# Patient Record
Sex: Female | Born: 1980 | Race: Asian | Hispanic: No | Marital: Married | Smoking: Never smoker
Health system: Southern US, Community
[De-identification: ages and names within clinical notes are randomized; demographics above are authoritative.]

## PROBLEM LIST (undated history)

## (undated) ENCOUNTER — Inpatient Hospital Stay (HOSPITAL_COMMUNITY): Payer: Self-pay

## (undated) DIAGNOSIS — Z789 Other specified health status: Secondary | ICD-10-CM

## (undated) HISTORY — PX: WISDOM TOOTH EXTRACTION: SHX21

## (undated) HISTORY — PX: APPENDECTOMY: SHX54

---

## 2012-12-04 ENCOUNTER — Ambulatory Visit
Admission: RE | Admit: 2012-12-04 | Discharge: 2012-12-04 | Disposition: A | Discharge status: Home / Self Care | Payer: Medicaid Other | Source: Ambulatory Visit | Attending: Family Medicine | Admitting: Family Medicine

## 2012-12-04 ENCOUNTER — Other Ambulatory Visit: Payer: Self-pay | Admitting: Family Medicine

## 2012-12-04 DIAGNOSIS — M549 Dorsalgia, unspecified: Secondary | ICD-10-CM

## 2013-05-12 LAB — CYTOLOGY - PAP: Pap: NEGATIVE

## 2013-05-31 ENCOUNTER — Inpatient Hospital Stay (HOSPITAL_COMMUNITY)
Admission: AD | Admit: 2013-05-31 | Discharge: 2013-05-31 | Disposition: A | Discharge status: Home / Self Care | Payer: Medicaid Other | Source: Ambulatory Visit | Attending: Obstetrics | Admitting: Obstetrics

## 2013-05-31 ENCOUNTER — Encounter (HOSPITAL_COMMUNITY): Payer: Self-pay

## 2013-05-31 DIAGNOSIS — O210 Mild hyperemesis gravidarum: Secondary | ICD-10-CM | POA: Insufficient documentation

## 2013-05-31 DIAGNOSIS — O99891 Other specified diseases and conditions complicating pregnancy: Secondary | ICD-10-CM | POA: Insufficient documentation

## 2013-05-31 DIAGNOSIS — R12 Heartburn: Secondary | ICD-10-CM | POA: Insufficient documentation

## 2013-05-31 DIAGNOSIS — O26891 Other specified pregnancy related conditions, first trimester: Secondary | ICD-10-CM

## 2013-05-31 DIAGNOSIS — O9989 Other specified diseases and conditions complicating pregnancy, childbirth and the puerperium: Secondary | ICD-10-CM

## 2013-05-31 HISTORY — DX: Other specified health status: Z78.9

## 2013-05-31 LAB — URINE MICROSCOPIC-ADD ON: RBC / HPF: NONE SEEN RBC/hpf (ref <3)

## 2013-05-31 LAB — URINALYSIS, ROUTINE W REFLEX MICROSCOPIC
Bilirubin Urine: NEGATIVE
GLUCOSE, UA: 100 mg/dL — AB
KETONES UR: NEGATIVE mg/dL
Nitrite: NEGATIVE
PROTEIN: NEGATIVE mg/dL
Specific Gravity, Urine: 1.01 (ref 1.005–1.030)
Urobilinogen, UA: 0.2 mg/dL (ref 0.0–1.0)
pH: 6.5 (ref 5.0–8.0)

## 2013-05-31 LAB — POCT PREGNANCY, URINE: Preg Test, Ur: POSITIVE — AB

## 2013-05-31 MED ORDER — FAMOTIDINE 20 MG PO TABS: 20.0000 mg | ORAL_TABLET | Freq: Two times a day (BID) | ORAL | Status: DC

## 2013-05-31 MED ORDER — METOCLOPRAMIDE HCL 10 MG PO TABS: 10.0000 mg | ORAL_TABLET | Freq: Four times a day (QID) | ORAL | Status: DC

## 2013-05-31 NOTE — MAU Note (Signed)
Feels like food is not being digested, is getting stuck in her esophagus and still feels very hungry. Has had heartburn as well. Had one episode of diarrhea 2 days ago. Tender breasts.

## 2013-05-31 NOTE — MAU Provider Note (Signed)
History     CSN: [id number]  Arrival date and time: 05/31/13 1413   First Provider Initiated Contact with Patient 05/31/13 1541      Chief Complaint  Patient presents with  . Shortness of Breath   HPI 33 y.o. G2P1001 at [redacted]w[redacted]d with nausea/vomiting, feeling that "food isn't going all the way down", reflux, gas. Has pregnancy termination scheduled for Tuesday.    Past Medical History  Diagnosis Date  . Medical history non-contributory     Past Surgical History  Procedure Laterality Date  . Appendectomy    . Wisdom tooth extraction      History reviewed. No pertinent family history.  History  Substance Use Topics  . Smoking status: Never Smoker   . Smokeless tobacco: Never Used  . Alcohol Use: No    Allergies: No Known Allergies  No prescriptions prior to admission    Review of Systems  Constitutional: Positive for malaise/fatigue. Negative for fever and chills.  Respiratory: Negative.   Cardiovascular: Negative.   Gastrointestinal: Positive for heartburn, nausea and vomiting. Negative for abdominal pain, diarrhea and constipation.  Genitourinary: Negative for dysuria, urgency, frequency, hematuria and flank pain.       Negative for vaginal bleeding, vaginal discharge  Musculoskeletal: Negative.   Neurological: Negative.   Psychiatric/Behavioral: Negative.    Physical Exam   Blood pressure 97/70, pulse 72, temperature 98.2 F (36.8 C), temperature source Oral, resp. rate 16, height 5\' 7"  (1.702 m), weight 112 lb (50.803 kg), last menstrual period 04/12/2013.  Physical Exam  Nursing note and vitals reviewed. Constitutional: She is oriented to person, place, and time. She appears well-developed and well-nourished. No distress.  Cardiovascular: Normal rate.   Respiratory: Effort normal.  GI: Soft. There is no tenderness.  Musculoskeletal: Normal range of motion.  Neurological: She is alert and oriented to person, place, and time.  Skin: Skin is warm and dry.   Psychiatric: She has a normal mood and affect.    MAU Course  Procedures Results for orders placed during the hospital encounter of 05/31/13 (from the past 24 hour(s))  URINALYSIS, ROUTINE W REFLEX MICROSCOPIC     Status: Abnormal   Collection Time    05/31/13  2:20 PM      Result Value Ref Range   Color, Urine YELLOW  YELLOW   APPearance CLEAR  CLEAR   Specific Gravity, Urine 1.010  1.005 - 1.030   pH 6.5  5.0 - 8.0   Glucose, UA 100 (*) NEGATIVE mg/dL   Hgb urine dipstick TRACE (*) NEGATIVE   Bilirubin Urine NEGATIVE  NEGATIVE   Ketones, ur NEGATIVE  NEGATIVE mg/dL   Protein, ur NEGATIVE  NEGATIVE mg/dL   Urobilinogen, UA 0.2  0.0 - 1.0 mg/dL   Nitrite NEGATIVE  NEGATIVE   Leukocytes, UA SMALL (*) NEGATIVE  URINE MICROSCOPIC-ADD ON     Status: Abnormal   Collection Time    05/31/13  2:20 PM      Result Value Ref Range   Squamous Epithelial / LPF FEW (*) RARE   WBC, UA 0-2  <3 WBC/hpf   RBC / HPF    <3 RBC/hpf   Value: NO FORMED ELEMENTS SEEN ON URINE MICROSCOPIC EXAMINATION   Bacteria, UA FEW (*) RARE  POCT PREGNANCY, URINE     Status: Abnormal   Collection Time    05/31/13  2:37 PM      Result Value Ref Range   Preg Test, Ur POSITIVE (*) NEGATIVE  Assessment and Plan   1. Heartburn in pregnancy in first trimester   Normal exam, general pregnancy discomforts - rev'd normal pregnancy sx vs. Warning signs, f/u as scheduled    Medication List         famotidine 20 MG tablet  Commonly known as:  PEPCID  Take 1 tablet (20 mg total) by mouth 2 (two) times daily.     metoCLOPramide 10 MG tablet  Commonly known as:  REGLAN  Take 1 tablet (10 mg total) by mouth 4 (four) times daily.            Follow-up Information   Follow up with [facility]. (as scheduled)         Ariana Baker 05/31/2013, 3:51 PM

## 2013-06-01 NOTE — MAU Provider Note (Signed)
Attestation of Attending Supervision of Advanced Practitioner (CNM/NP): Evaluation and management procedures were performed by the Advanced Practitioner under my supervision and collaboration.  I have reviewed the Advanced Practitioner's note and chart, and I agree with the management and plan.  Snyder Colavito 06/01/2013 7:51 AM

## 2014-02-15 ENCOUNTER — Encounter (HOSPITAL_COMMUNITY): Payer: Self-pay

## 2014-04-05 ENCOUNTER — Encounter (HOSPITAL_COMMUNITY): Payer: Self-pay | Admitting: *Deleted

## 2014-08-03 ENCOUNTER — Other Ambulatory Visit (HOSPITAL_COMMUNITY)
Admission: RE | Admit: 2014-08-03 | Discharge: 2014-08-03 | Disposition: A | Discharge status: Home / Self Care | Payer: 59 | Source: Ambulatory Visit | Attending: Family Medicine | Admitting: Family Medicine

## 2014-08-03 ENCOUNTER — Other Ambulatory Visit: Payer: Self-pay | Admitting: Family Medicine

## 2014-08-03 DIAGNOSIS — Z124 Encounter for screening for malignant neoplasm of cervix: Secondary | ICD-10-CM | POA: Insufficient documentation

## 2014-08-05 LAB — CYTOLOGY - PAP

## 2015-12-01 ENCOUNTER — Inpatient Hospital Stay (HOSPITAL_COMMUNITY)
Admission: AD | Admit: 2015-12-01 | Discharge: 2015-12-01 | Disposition: A | Discharge status: Home / Self Care | Payer: Medicaid Other | Source: Ambulatory Visit | Attending: Obstetrics and Gynecology | Admitting: Obstetrics and Gynecology

## 2015-12-01 ENCOUNTER — Inpatient Hospital Stay (HOSPITAL_COMMUNITY): Payer: Medicaid Other

## 2015-12-01 ENCOUNTER — Encounter (HOSPITAL_COMMUNITY): Payer: Self-pay | Admitting: *Deleted

## 2015-12-01 DIAGNOSIS — O3411 Maternal care for benign tumor of corpus uteri, first trimester: Secondary | ICD-10-CM | POA: Insufficient documentation

## 2015-12-01 DIAGNOSIS — O209 Hemorrhage in early pregnancy, unspecified: Secondary | ICD-10-CM | POA: Diagnosis not present

## 2015-12-01 DIAGNOSIS — Z3A01 Less than 8 weeks gestation of pregnancy: Secondary | ICD-10-CM | POA: Insufficient documentation

## 2015-12-01 DIAGNOSIS — R109 Unspecified abdominal pain: Secondary | ICD-10-CM | POA: Diagnosis present

## 2015-12-01 DIAGNOSIS — M549 Dorsalgia, unspecified: Secondary | ICD-10-CM | POA: Diagnosis present

## 2015-12-01 DIAGNOSIS — D259 Leiomyoma of uterus, unspecified: Secondary | ICD-10-CM | POA: Diagnosis not present

## 2015-12-01 DIAGNOSIS — O3441 Maternal care for other abnormalities of cervix, first trimester: Secondary | ICD-10-CM | POA: Insufficient documentation

## 2015-12-01 DIAGNOSIS — N939 Abnormal uterine and vaginal bleeding, unspecified: Secondary | ICD-10-CM | POA: Diagnosis present

## 2015-12-01 LAB — WET PREP, GENITAL
CLUE CELLS WET PREP: NONE SEEN
Sperm: NONE SEEN
TRICH WET PREP: NONE SEEN
Yeast Wet Prep HPF POC: NONE SEEN

## 2015-12-01 LAB — CBC
HEMATOCRIT: 39.8 % (ref 36.0–46.0)
Hemoglobin: 12.9 g/dL (ref 12.0–15.0)
MCH: 29.4 pg (ref 26.0–34.0)
MCHC: 32.4 g/dL (ref 30.0–36.0)
MCV: 90.7 fL (ref 78.0–100.0)
PLATELETS: 251 10*3/uL (ref 150–400)
RBC: 4.39 MIL/uL (ref 3.87–5.11)
RDW: 13.8 % (ref 11.5–15.5)
WBC: 7.8 10*3/uL (ref 4.0–10.5)

## 2015-12-01 LAB — URINALYSIS, ROUTINE W REFLEX MICROSCOPIC
BILIRUBIN URINE: NEGATIVE
Glucose, UA: NEGATIVE mg/dL
Ketones, ur: NEGATIVE mg/dL
NITRITE: NEGATIVE
PROTEIN: NEGATIVE mg/dL
Specific Gravity, Urine: 1.01 (ref 1.005–1.030)
pH: 5.5 (ref 5.0–8.0)

## 2015-12-01 LAB — URINE MICROSCOPIC-ADD ON

## 2015-12-01 LAB — HCG, QUANTITATIVE, PREGNANCY: hCG, Beta Chain, Quant, S: 830 m[IU]/mL — ABNORMAL HIGH (ref <5)

## 2015-12-01 LAB — POCT PREGNANCY, URINE: PREG TEST UR: POSITIVE — AB

## 2015-12-01 LAB — ABO/RH: ABO/RH(D): A POS

## 2015-12-01 NOTE — MAU Provider Note (Signed)
Chief Complaint: Abdominal Pain; Back Pain; and Vaginal Bleeding   First Provider Initiated Contact with Patient 12/01/15 Ariana Baker is a 35 y.o. G2P1001 at [redacted]w[redacted]d by LMP who presents to maternity admissions reporting bleeding and low back pain since the weekend.  Bleeding started 2 days ago, like period. She denies vaginal itching/burning, urinary symptoms, h/a, dizziness, n/v, or fever/chills.    Vaginal Bleeding  The patient's primary symptoms include pelvic pain and vaginal bleeding. The patient's pertinent negatives include no genital itching or genital lesions. This is a new problem. The current episode started in the past 7 days. The problem occurs constantly. The problem has been unchanged. The pain is mild. She is pregnant. Associated symptoms include abdominal pain and back pain. Pertinent negatives include no chills, diarrhea, fever, nausea or vomiting. The vaginal discharge was bloody. The vaginal bleeding is lighter than menses. She has not been passing clots. She has not been passing tissue.    RN Note: Pt had pos HPT on Saturday, started having back pain on Monday.  Also having lower abd pain since Tuesday, started bleeding Tuesday night   Past Medical History:  Diagnosis Date  . Medical history non-contributory    Past Surgical History:  Procedure Laterality Date  . APPENDECTOMY    . WISDOM TOOTH EXTRACTION     Social History   Social History  . Marital status: Married    Spouse name: N/A  . Number of children: N/A  . Years of education: N/A   Occupational History  . Not on file.   Social History Main Topics  . Smoking status: Never Smoker  . Smokeless tobacco: Never Used  . Alcohol use No  . Drug use: No  . Sexual activity: Yes    Birth control/ protection: None   Other Topics Concern  . Not on file   Social History Narrative  . No narrative on file   No current facility-administered medications on file prior to encounter.    No  current outpatient prescriptions on file prior to encounter.   No Known Allergies  I have reviewed patient's Past Medical Hx, Surgical Hx, Family Hx, Social Hx, medications and allergies.   ROS:  Review of Systems  Constitutional: Negative for chills and fever.  Gastrointestinal: Positive for abdominal pain. Negative for diarrhea, nausea and vomiting.  Genitourinary: Positive for pelvic pain and vaginal bleeding.  Musculoskeletal: Positive for back pain.   Other systems negative  Physical Exam  Patient Vitals for the past 24 hrs:  BP Temp Temp src Pulse Resp Height Weight  12/01/15 1327 94/61 98.3 F (36.8 C) Oral 73 18 5\' 7"  (1.702 m) 53.5 kg (118 lb)    Physical Exam  Constitutional: Well-developed, well-nourished female in no acute distress.  Cardiovascular: normal rate Respiratory: normal effort GI: Abd soft, non-tender. Pos BS x 4 MS: Extremities nontender, no edema, normal ROM Neurologic: Alert and oriented x 4.  GU: Neg CVAT.  PELVIC EXAM: Cervix pink, visually closed, without lesion, scant red discharge, vaginal walls and external genitalia normal Bimanual exam: Cervix 0/long/high, firm, anterior, neg CMT, uterus nontender, enlarged 6 week size, adnexa without tenderness, enlargement, or mass   LAB RESULTS Results for orders placed or performed during the hospital encounter of 12/01/15 (from the past 72 hour(s))  GC/Chlamydia probe amp ([place])not at [facility]     Status: None   Collection Time: 12/01/15 12:00 AM  Result Value Ref Range  Chlamydia Negative     Comment: Normal Reference Range - Negative   Neisseria gonorrhea Negative     Comment: Normal Reference Range - Negative  Urinalysis, Routine w reflex microscopic (not at [facility])     Status: Abnormal   Collection Time: 12/01/15  1:32 PM  Result Value Ref Range   Color, Urine YELLOW YELLOW   APPearance HAZY (A) CLEAR   Specific Gravity, Urine 1.010 1.005 - 1.030   pH 5.5 5.0 - 8.0   Glucose, UA NEGATIVE  NEGATIVE mg/dL   Hgb urine dipstick LARGE (A) NEGATIVE   Bilirubin Urine NEGATIVE NEGATIVE   Ketones, ur NEGATIVE NEGATIVE mg/dL   Protein, ur NEGATIVE NEGATIVE mg/dL   Nitrite NEGATIVE NEGATIVE   Leukocytes, UA SMALL (A) NEGATIVE  Urine microscopic-add on     Status: Abnormal   Collection Time: 12/01/15  1:32 PM  Result Value Ref Range   Squamous Epithelial / LPF 6-30 (A) NONE SEEN   WBC, UA 0-5 0 - 5 WBC/hpf   RBC / HPF TOO NUMEROUS TO COUNT 0 - 5 RBC/hpf   Bacteria, UA MANY (A) NONE SEEN  CBC     Status: None   Collection Time: 12/01/15  1:51 PM  Result Value Ref Range   WBC 7.8 4.0 - 10.5 K/uL   RBC 4.39 3.87 - 5.11 MIL/uL   Hemoglobin 12.9 12.0 - 15.0 g/dL   HCT 39.8 36.0 - 46.0 %   MCV 90.7 78.0 - 100.0 fL   MCH 29.4 26.0 - 34.0 pg   MCHC 32.4 30.0 - 36.0 g/dL   RDW 13.8 11.5 - 15.5 %   Platelets 251 150 - 400 K/uL  HIV antibody (routine testing) (NOT for [facility])     Status: None   Collection Time: 12/01/15  1:51 PM  Result Value Ref Range   HIV Screen 4th Generation wRfx Non Reactive Non Reactive    Comment: (NOTE) Performed At: [facility] [place], [place] [id number] Lindon Romp MD [id number]   hCG, quantitative, pregnancy     Status: Abnormal   Collection Time: 12/01/15  1:59 PM  Result Value Ref Range   hCG, Beta Chain, Quant, S 830 (H) <5 mIU/mL    Comment:          GEST. AGE      CONC.  (mIU/mL)   <=1 WEEK        5 - 50     2 WEEKS       50 - 500     3 WEEKS       100 - 10,000     4 WEEKS     1,000 - 30,000     5 WEEKS     3,500 - 115,000   6-8 WEEKS     12,000 - 270,000    12 WEEKS     15,000 - 220,000        FEMALE AND NON-PREGNANT FEMALE:     LESS THAN 5 mIU/mL   ABO/Rh     Status: None   Collection Time: 12/01/15  1:59 PM  Result Value Ref Range   ABO/RH(D) A POS   Pregnancy, urine POC     Status: Abnormal   Collection Time: 12/01/15  2:11 PM  Result Value Ref Range   Preg Test, Ur POSITIVE (A) NEGATIVE     Comment:        THE SENSITIVITY OF THIS METHODOLOGY IS >24 mIU/mL   Wet prep, genital  Status: Abnormal   Collection Time: 12/01/15  2:16 PM  Result Value Ref Range   Yeast Wet Prep HPF POC NONE SEEN NONE SEEN   Trich, Wet Prep NONE SEEN NONE SEEN   Clue Cells Wet Prep HPF POC NONE SEEN NONE SEEN   WBC, Wet Prep HPF POC MANY (A) NONE SEEN    Comment: MANY BACTERIA SEEN   Sperm NONE SEEN     IMAGING US Ob Comp Less 14 Wks  Result Date: 12/01/2015 CLINICAL DATA:  Sharp right-sided pain. Spotting for 2 days. Positive pregnancy test. EXAM: OBSTETRIC <14 WK [place] AND TRANSVAGINAL OB US TECHNIQUE: Both transabdominal and transvaginal ultrasound examinations were performed for complete evaluation of the gestation as well as the maternal uterus, adnexal regions, and pelvic cul-de-sac. Transvaginal technique was performed to assess early pregnancy. COMPARISON:  None. FINDINGS: Intrauterine gestational sac: None Maternal uterus/adnexae: Uterus is heterogeneous with multiple nabothian cysts in the cervix. Uterus appears to be retroflexed. There is a heterogeneous structure within the uterus that could represent a fibroid measuring 3.9 x 3.1 x 3.7 cm. Possible second fibroid measuring up to 1.6 cm. Normal appearance of the right ovary measuring 2.6 x 1.7 x 1.6 cm. Left ovary measures 3.1 x 2.1 x 2.6 cm. Heterogeneous structure in left ovary could represent a corpus luteum. No significant free fluid. IMPRESSION: No intrauterine pregnancy visualized. By definition, in the setting of a positive pregnancy test, this reflects a pregnancy of unknown location. Differential considerations include early normal IUP, abnormal IUP/missed abortion, or nonvisualized ectopic pregnancy. Serial beta HCG is suggested. Consider repeat pelvic ultrasound in 14 days. Uterine fibroids. Electronically Signed   By: Markus Daft M.D.   On: 12/01/2015 16:24   US Ob Transvaginal  Result Date: 12/01/2015 CLINICAL DATA:  Sharp  right-sided pain. Spotting for 2 days. Positive pregnancy test. EXAM: OBSTETRIC <14 WK [place] AND TRANSVAGINAL OB US TECHNIQUE: Both transabdominal and transvaginal ultrasound examinations were performed for complete evaluation of the gestation as well as the maternal uterus, adnexal regions, and pelvic cul-de-sac. Transvaginal technique was performed to assess early pregnancy. COMPARISON:  None. FINDINGS: Intrauterine gestational sac: None Maternal uterus/adnexae: Uterus is heterogeneous with multiple nabothian cysts in the cervix. Uterus appears to be retroflexed. There is a heterogeneous structure within the uterus that could represent a fibroid measuring 3.9 x 3.1 x 3.7 cm. Possible second fibroid measuring up to 1.6 cm. Normal appearance of the right ovary measuring 2.6 x 1.7 x 1.6 cm. Left ovary measures 3.1 x 2.1 x 2.6 cm. Heterogeneous structure in left ovary could represent a corpus luteum. No significant free fluid. IMPRESSION: No intrauterine pregnancy visualized. By definition, in the setting of a positive pregnancy test, this reflects a pregnancy of unknown location. Differential considerations include early normal IUP, abnormal IUP/missed abortion, or nonvisualized ectopic pregnancy. Serial beta HCG is suggested. Consider repeat pelvic ultrasound in 14 days. Uterine fibroids. Electronically Signed   By: Markus Daft M.D.   On: 12/01/2015 16:24    MAU Management/MDM: Ordered usual first trimester r/o ectopic labs.   Pelvic exam and cultures done Will check baseline Ultrasound to rule out ectopic.   This bleeding/pain can represent a normal pregnancy with bleeding, spontaneous abortion or even an ectopic which can be life-threatening.  The process as listed above helps to determine which of these is present.  Still cannot rule out ectopic pregnancy. Discussed results.  Plan repeat HCG in 48 hours.   ASSESSMENT Pregnancy at [redacted]w[redacted]d by LMP Nothing seen in  uterus yet Threatened abortion vs ectopic vs  very early pregnancy  PLAN Discharge home Plan to repeat HCG level in 48 hours in clinic per 11:00 am schedule Will repeat  Ultrasound in about 7-10 days if HCG levels double appropriately     Medication List    You have not been prescribed any medications.     Pt stable at time of discharge. Encouraged to return here or to other Urgent Care/ED if she develops worsening of symptoms, increase in pain, fever, or other concerning symptoms.    Hansel Feinstein CNM, MSN Certified Nurse-Midwife 12/01/2015  2:08 PM

## 2015-12-01 NOTE — MAU Note (Signed)
Pt had pos HPT on Saturday, started having back pain on Monday.  Also having lower abd pain since Tuesday, started bleeding Tuesday night.

## 2015-12-01 NOTE — Discharge Instructions (Signed)

## 2015-12-02 ENCOUNTER — Encounter (HOSPITAL_COMMUNITY): Payer: Self-pay

## 2015-12-02 ENCOUNTER — Other Ambulatory Visit: Payer: Self-pay | Admitting: Advanced Practice Midwife

## 2015-12-02 ENCOUNTER — Inpatient Hospital Stay (HOSPITAL_COMMUNITY)
Admission: AD | Admit: 2015-12-02 | Discharge: 2015-12-02 | Disposition: A | Discharge status: Home / Self Care | Payer: Medicaid Other | Source: Ambulatory Visit | Attending: Obstetrics and Gynecology | Admitting: Obstetrics and Gynecology

## 2015-12-02 DIAGNOSIS — Z3A01 Less than 8 weeks gestation of pregnancy: Secondary | ICD-10-CM | POA: Diagnosis present

## 2015-12-02 DIAGNOSIS — O468X1 Other antepartum hemorrhage, first trimester: Secondary | ICD-10-CM | POA: Insufficient documentation

## 2015-12-02 DIAGNOSIS — Z9889 Other specified postprocedural states: Secondary | ICD-10-CM | POA: Diagnosis not present

## 2015-12-02 DIAGNOSIS — O039 Complete or unspecified spontaneous abortion without complication: Secondary | ICD-10-CM | POA: Insufficient documentation

## 2015-12-02 DIAGNOSIS — O034 Incomplete spontaneous abortion without complication: Secondary | ICD-10-CM

## 2015-12-02 LAB — GC/CHLAMYDIA PROBE AMP (~~LOC~~) NOT AT ARMC
Chlamydia: NEGATIVE
NEISSERIA GONORRHEA: NEGATIVE

## 2015-12-02 LAB — HIV ANTIBODY (ROUTINE TESTING W REFLEX): HIV Screen 4th Generation wRfx: NONREACTIVE

## 2015-12-02 LAB — HCG, QUANTITATIVE, PREGNANCY: HCG, BETA CHAIN, QUANT, S: 340 m[IU]/mL — AB (ref <5)

## 2015-12-02 NOTE — MAU Provider Note (Signed)
Chief Complaint: Vaginal Bleeding   First Provider Initiated Contact with Patient 12/02/15 2019        SUBJECTIVE Ariana Baker is a 35 y.o. G3P1011 at [redacted]w[redacted]d by LMP who presents to maternity admissions reporting increased bleeding "like first day of period".  No cramping.. She denies vaginal itching/burning, urinary symptoms, h/a, dizziness, n/v, or fever/chills. Was seen here yesterday by me for bleeding in pregnancy. Pregnancy is not desired.  Was scheduled for repeat HCG tomorrow.    Vaginal Bleeding  The patient's primary symptoms include vaginal bleeding. The patient's pertinent negatives include no genital itching, genital lesions, genital odor or pelvic pain. This is a recurrent problem. The current episode started yesterday. The problem occurs constantly. The problem has been gradually worsening. The patient is experiencing no pain. She is pregnant. Pertinent negatives include no abdominal pain, back pain, chills, constipation, dysuria, fever or vomiting. The vaginal discharge was bloody. The vaginal bleeding is typical of menses. She has been passing clots. She has not been passing tissue. Nothing aggravates the symptoms. She has tried nothing for the symptoms.      Past Medical History:  Diagnosis Date  . Medical history non-contributory    Past Surgical History:  Procedure Laterality Date  . APPENDECTOMY    . WISDOM TOOTH EXTRACTION     Social History   Social History  . Marital status: Married    Spouse name: N/A  . Number of children: N/A  . Years of education: N/A   Occupational History  . Not on file.   Social History Main Topics  . Smoking status: Never Smoker  . Smokeless tobacco: Never Used  . Alcohol use No  . Drug use: No  . Sexual activity: Yes    Birth control/ protection: None   Other Topics Concern  . Not on file   Social History Narrative  . No narrative on file   No current facility-administered medications on file prior to encounter.    No  current outpatient prescriptions on file prior to encounter.   No Known Allergies  I have reviewed patient's Past Medical Hx, Surgical Hx, Family Hx, Social Hx, medications and allergies.   ROS:  Review of Systems  Constitutional: Negative for chills and fever.  Gastrointestinal: Negative for abdominal pain, constipation and vomiting.  Genitourinary: Positive for vaginal bleeding. Negative for dysuria and pelvic pain.  Musculoskeletal: Negative for back pain.   Other systems negative  Physical Exam  Patient Vitals for the past 24 hrs:  BP Temp Temp src Pulse Resp SpO2 Height Weight  12/02/15 1949 - - - - - - 5\' 7"  (1.702 m) 53.1 kg (117 lb)  12/02/15 1946 97/66 98 F (36.7 C) Oral 69 16 100 % - -   Physical Exam  Constitutional: Well-developed, well-nourished female in no acute distress.  Cardiovascular: normal rate Respiratory: normal effort GI: Abd soft, non-tender. Pos BS x 4 MS: Extremities nontender, no edema, normal ROM Neurologic: Alert and oriented x 4.  GU: Neg CVAT.  PELVIC EXAM:  Moderate bright red blood on pad,  No overt hemorrhage   LAB RESULTS  --/--/A POS (08/17 1359)  IMAGING US Ob Comp Less 14 Wks  Result Date: 12/01/2015 CLINICAL DATA:  Sharp right-sided pain. Spotting for 2 days. Positive pregnancy test. EXAM: OBSTETRIC <14 WK [place] AND TRANSVAGINAL OB US TECHNIQUE: Both transabdominal and transvaginal ultrasound examinations were performed for complete evaluation of the gestation as well as the maternal uterus, adnexal regions, and pelvic cul-de-sac. Transvaginal  technique was performed to assess early pregnancy. COMPARISON:  None. FINDINGS: Intrauterine gestational sac: None Maternal uterus/adnexae: Uterus is heterogeneous with multiple nabothian cysts in the cervix. Uterus appears to be retroflexed. There is a heterogeneous structure within the uterus that could represent a fibroid measuring 3.9 x 3.1 x 3.7 cm. Possible second fibroid measuring up to 1.6  cm. Normal appearance of the right ovary measuring 2.6 x 1.7 x 1.6 cm. Left ovary measures 3.1 x 2.1 x 2.6 cm. Heterogeneous structure in left ovary could represent a corpus luteum. No significant free fluid. IMPRESSION: No intrauterine pregnancy visualized. By definition, in the setting of a positive pregnancy test, this reflects a pregnancy of unknown location. Differential considerations include early normal IUP, abnormal IUP/missed abortion, or nonvisualized ectopic pregnancy. Serial beta HCG is suggested. Consider repeat pelvic ultrasound in 14 days. Uterine fibroids. Electronically Signed   By: Markus Daft M.D.   On: 12/01/2015 16:24   US Ob Transvaginal  Result Date: 12/01/2015 CLINICAL DATA:  Sharp right-sided pain. Spotting for 2 days. Positive pregnancy test. EXAM: OBSTETRIC <14 WK [place] AND TRANSVAGINAL OB US TECHNIQUE: Both transabdominal and transvaginal ultrasound examinations were performed for complete evaluation of the gestation as well as the maternal uterus, adnexal regions, and pelvic cul-de-sac. Transvaginal technique was performed to assess early pregnancy. COMPARISON:  None. FINDINGS: Intrauterine gestational sac: None Maternal uterus/adnexae: Uterus is heterogeneous with multiple nabothian cysts in the cervix. Uterus appears to be retroflexed. There is a heterogeneous structure within the uterus that could represent a fibroid measuring 3.9 x 3.1 x 3.7 cm. Possible second fibroid measuring up to 1.6 cm. Normal appearance of the right ovary measuring 2.6 x 1.7 x 1.6 cm. Left ovary measures 3.1 x 2.1 x 2.6 cm. Heterogeneous structure in left ovary could represent a corpus luteum. No significant free fluid. IMPRESSION: No intrauterine pregnancy visualized. By definition, in the setting of a positive pregnancy test, this reflects a pregnancy of unknown location. Differential considerations include early normal IUP, abnormal IUP/missed abortion, or nonvisualized ectopic pregnancy. Serial beta HCG  is suggested. Consider repeat pelvic ultrasound in 14 days. Uterine fibroids. Electronically Signed   By: Markus Daft M.D.   On: 12/01/2015 16:24    MAU Management/MDM: Repeat HCG level drawn. Level has decreased since yesterday. Discussed with patient this indicated an inevitable abortion. Will plan to monitor weekly until zero.  Pt indicates understanding  This bleeding/pain can represent a normal pregnancy with bleeding, spontaneous abortion or even an ectopic which can be life-threatening.  The process as listed above helps to determine which of these is present.    ASSESSMENT Pregnancy at [redacted]w[redacted]d by LMP Vaginal bleeding, increased Inevitable spontaneous abortion  PLAN Discharge home Plan to repeat HCG level in 1 week in clinic per 11:00 am schedule Follow levels to zero, weekly     Medication List    You have not been prescribed any medications.     Pt stable at time of discharge. Encouraged to return here or to other Urgent Care/ED if she develops worsening of symptoms, increase in pain, fever, or other concerning symptoms.    Hansel Feinstein CNM, MSN Certified Nurse-Midwife 12/02/2015  8:19 PM

## 2015-12-02 NOTE — Discharge Instructions (Signed)
Incomplete Miscarriage A miscarriage is the sudden loss of an unborn baby (fetus) before the 20th week of pregnancy. In an incomplete miscarriage, parts of the fetus or placenta (afterbirth) remain in the body.  Having a miscarriage can be an emotional experience. Talk with your health care provider about any questions you may have about miscarrying, the grieving process, and your future pregnancy plans. CAUSES   Problems with the fetal chromosomes that make it impossible for the baby to develop normally. Problems with the baby's genes or chromosomes are most often the result of errors that occur by chance as the embryo divides and grows. The problems are not inherited from the parents.  Infection of the cervix or uterus.  Hormone problems.  Problems with the cervix, such as having an incompetent cervix. This is when the tissue in the cervix is not strong enough to hold the pregnancy.  Problems with the uterus, such as an abnormally shaped uterus, uterine fibroids, or congenital abnormalities.  Certain medical conditions.  Smoking, drinking alcohol, or taking illegal drugs.  Trauma. SYMPTOMS   Vaginal bleeding or spotting, with or without cramps or pain.  Pain or cramping in the abdomen or lower back.  Passing fluid, tissue, or blood clots from the vagina. DIAGNOSIS  Your health care provider will perform a physical exam. You may also have an ultrasound to confirm the miscarriage. Blood or urine tests may also be ordered. TREATMENT   Usually, a dilation and curettage (D&C) procedure is performed. During a D&C procedure, the cervix is widened (dilated) and any remaining fetal or placental tissue is gently removed from the uterus.  Antibiotic medicines are prescribed if there is an infection. Other medicines may be given to reduce the size of the uterus (contract) if there is a lot of bleeding.  If you have Rh negative blood and your baby was Rh positive, you will need a Rho (D)  immune globulin shot. This shot will protect any future baby from having Rh blood problems in future pregnancies.  You may be confined to bed rest. This means you should stay in bed and only get up to use the bathroom. HOME CARE INSTRUCTIONS   Rest as directed by your health care provider.  Restrict activity as directed by your health care provider. You may be allowed to continue light activity if curettage was not done but you require further treatment.  Keep track of the number of pads you use each day. Keep track of how soaked (saturated) they are. Record this information.  Do not  use tampons.  Do not douche or have sexual intercourse until approved by your health care provider.  Keep all follow-up appointments for reevaluation and continuing management.  Only take over-the-counter or prescription medicines for pain, fever, or discomfort as directed by your health care provider.  Take antibiotic medicine as directed by your health care provider. Make sure you finish it even if you start to feel better. SEEK IMMEDIATE MEDICAL CARE IF:   You experience severe cramps in your stomach, back, or abdomen.  You have an unexplained temperature (make sure to record these temperatures).  You pass large clots or tissue (save these for your health care provider to inspect).  Your bleeding increases.  You become light-headed, weak, or have fainting episodes. MAKE SURE YOU:   Understand these instructions.  Will watch your condition.  Will get help right away if you are not doing well or get worse.   This information is not intended to   replace advice given to you by your health care provider. Make sure you discuss any questions you have with your health care provider.   Document Released: 04/02/2005 Document Revised: 04/23/2014 Document Reviewed: 10/30/2012 Elsevier Interactive Patient Education 2016 Elsevier Inc.  

## 2015-12-02 NOTE — MAU Note (Signed)
Pt states that she was here yesterday and was suppose to follow up tomorrow for hcg level. States that her bleeding became heavier today and she passed a clot. Had some discomfort earlier, but none now.

## 2015-12-08 ENCOUNTER — Other Ambulatory Visit: Payer: Medicaid Other

## 2015-12-08 DIAGNOSIS — O3680X0 Pregnancy with inconclusive fetal viability, not applicable or unspecified: Secondary | ICD-10-CM

## 2015-12-08 DIAGNOSIS — O039 Complete or unspecified spontaneous abortion without complication: Secondary | ICD-10-CM

## 2015-12-08 NOTE — Progress Notes (Unsigned)
Reviewed patient's chart with Dr Roselie Awkward who states lab does not need to be stat today- can be ran routinely.

## 2015-12-09 LAB — HCG, QUANTITATIVE, PREGNANCY: hCG, Beta Chain, Quant, S: 30 m[IU]/mL — ABNORMAL HIGH

## 2015-12-11 ENCOUNTER — Encounter: Payer: Self-pay | Admitting: Advanced Practice Midwife

## 2015-12-11 DIAGNOSIS — O039 Complete or unspecified spontaneous abortion without complication: Secondary | ICD-10-CM

## 2015-12-11 DIAGNOSIS — O034 Incomplete spontaneous abortion without complication: Secondary | ICD-10-CM | POA: Insufficient documentation

## 2015-12-12 ENCOUNTER — Telehealth: Payer: Self-pay | Admitting: *Deleted

## 2015-12-12 NOTE — Telephone Encounter (Signed)
Called pt and informed her that her recent lab test result has been reviewed by the doctor. The pregnancy hormone level has reduced and is consistent with a miscarriage. We would like to see her in the office for follow up.  Pt will need to abstain from intercourse for 2 more weeks and then use condoms to avoid pregnancy. Pt voiced understanding and stated that she would like to have IUD placed @ her appt. She reports that she has [employer]. I stated that a staff member will call her with appt details. She agreed to have detailed message left if there is no answer.

## 2015-12-29 ENCOUNTER — Encounter: Payer: Self-pay | Admitting: Obstetrics and Gynecology

## 2015-12-29 ENCOUNTER — Ambulatory Visit (INDEPENDENT_AMBULATORY_CARE_PROVIDER_SITE_OTHER): Payer: Medicaid Other | Admitting: Obstetrics and Gynecology

## 2015-12-29 VITALS — BP 106/72 | HR 77 | Ht 67.0 in | Wt 120.1 lb

## 2015-12-29 DIAGNOSIS — Z30430 Encounter for insertion of intrauterine contraceptive device: Secondary | ICD-10-CM | POA: Diagnosis present

## 2015-12-29 DIAGNOSIS — Z3202 Encounter for pregnancy test, result negative: Secondary | ICD-10-CM | POA: Diagnosis not present

## 2015-12-29 LAB — POCT PREGNANCY, URINE: Preg Test, Ur: NEGATIVE

## 2015-12-29 MED ORDER — PARAGARD INTRAUTERINE COPPER IU IUD
1.0000 [IU] | INTRAUTERINE_SYSTEM | Freq: Once | INTRAUTERINE | Status: AC
  Administered 2015-12-29: 1 [IU] via INTRAUTERINE

## 2015-12-29 NOTE — Progress Notes (Signed)
IUD Insertion Procedure Note  Pre-operative Diagnosis: Desire for contraception  Post-operative Diagnosis: same  Indications: contraception  Procedure Details  Urine pregnancy test negative.  The risks (including infection, bleeding, pain, and uterine perforation) and benefits of the procedure were explained to the patient and Written informed consent was obtained.    Cervix cleansed with Betadine. Uterus sounded to 9 cm. IUD inserted without difficulty. String visible and trimmed. Patient tolerated procedure well.  IUD Information: ParaGard.  Condition: Stable  Complications: None  Plan:  The patient was advised to call for any fever or for prolonged or severe pain or bleeding. She was advised to use OTC ibuprofen as needed for mild to moderate pain.   Attending Physician Documentation: I was present for or participated in the entire procedure, including opening and closing.

## 2015-12-29 NOTE — Patient Instructions (Signed)
Intrauterine Device Insertion Most often, an intrauterine device (IUD) is inserted into the uterus to prevent pregnancy. There are 2 types of IUDs available:  Copper IUD--This type of IUD creates an environment that is not favorable to sperm survival. The mechanism of action of the copper IUD is not known for certain. It can stay in place for 10 years.  Hormone IUD--This type of IUD contains the hormone progestin (synthetic progesterone). The progestin thickens the cervical mucus and prevents sperm from entering the uterus, and it also thins the uterine lining. There is no evidence that the hormone IUD prevents implantation. One hormone IUD can stay in place for up to 5 years, and a different hormone IUD can stay in place for up to 3 years. An IUD is the most cost-effective birth control if left in place for the full duration. It may be removed at any time. LET YOUR HEALTH CARE PROVIDER KNOW ABOUT:  Any allergies you have.  All medicines you are taking, including vitamins, herbs, eye drops, creams, and over-the-counter medicines.  Previous problems you or members of your family have had with the use of anesthetics.  Any blood disorders you have.  Previous surgeries you have had.  Possibility of pregnancy.  Medical conditions you have. RISKS AND COMPLICATIONS  Generally, intrauterine device insertion is a safe procedure. However, as with any procedure, complications can occur. Possible complications include:  Accidental puncture (perforation) of the uterus.  Accidental placement of the IUD either in the muscle layer of the uterus (myometrium) or outside the uterus. If this happens, the IUD can be found essentially floating around the bowels and must be taken out surgically.  The IUD may fall out of the uterus (expulsion). This is more common in women who have recently had a child.   Pregnancy in the fallopian tube (ectopic).  Pelvic inflammatory disease (PID), which is infection of  the uterus and fallopian tubes. The risk of PID is slightly increased in the first 20 days after the IUD is placed, but the overall risk is still very low. BEFORE THE PROCEDURE  Schedule the IUD insertion for when you will have your menstrual period or right after, to make sure you are not pregnant. Placement of the IUD is better tolerated shortly after a menstrual cycle.  You may need to take tests or be examined to make sure you are not pregnant.  You may be required to take a pregnancy test.  You may be required to get checked for sexually transmitted infections (STIs) prior to placement. Placing an IUD in someone who has an infection can make the infection worse.  You may be given a pain reliever to take 1 or 2 hours before the procedure.  An exam will be performed to determine the size and position of your uterus.  Ask your health care provider about changing or stopping your regular medicines. PROCEDURE   A tool (speculum) is placed in the vagina. This allows your health care provider to see the lower part of the uterus (cervix).  The cervix is prepped with a medicine that lowers the risk of infection.  You may be given a medicine to numb each side of the cervix (intracervical or paracervical block). This is used to block and control any discomfort with insertion.  A tool (uterine sound) is inserted into the uterus to determine the length of the uterine cavity and the direction the uterus may be tilted.  A slim instrument (IUD inserter) is inserted through the cervical   canal and into your uterus.  The IUD is placed in the uterine cavity and the insertion device is removed.  The nylon string that is attached to the IUD and used for eventual IUD removal is trimmed. It is trimmed so that it lays high in the vagina, just outside the cervix. AFTER THE PROCEDURE  You may have bleeding after the procedure. This is normal. It varies from light spotting for a few days to menstrual-like  bleeding.  You may have mild cramping.   This information is not intended to replace advice given to you by your health care provider. Make sure you discuss any questions you have with your health care provider.   Document Released: 11/29/2010 Document Revised: 01/21/2013 Document Reviewed: 09/21/2012 Elsevier Interactive Patient Education 2016 Elsevier Inc.  

## 2016-01-05 ENCOUNTER — Encounter: Payer: Self-pay | Admitting: *Deleted

## 2016-02-09 ENCOUNTER — Encounter: Payer: Self-pay | Admitting: Obstetrics and Gynecology

## 2016-02-09 ENCOUNTER — Ambulatory Visit (INDEPENDENT_AMBULATORY_CARE_PROVIDER_SITE_OTHER): Payer: Medicaid Other | Admitting: Obstetrics and Gynecology

## 2016-02-09 VITALS — BP 99/66 | HR 67 | Ht 66.54 in | Wt 116.0 lb

## 2016-02-09 DIAGNOSIS — Z30431 Encounter for routine checking of intrauterine contraceptive device: Secondary | ICD-10-CM

## 2016-02-09 DIAGNOSIS — N939 Abnormal uterine and vaginal bleeding, unspecified: Secondary | ICD-10-CM

## 2016-02-09 NOTE — Progress Notes (Signed)
Obstetrics and Gynecology Visit Return Patient Evaluation  Appointment Date: 02/09/2016  Chief Complaint: IUD string check  History of Present Illness:  Ariana Baker is a 35 y.o. [id number] (LMP: last week) that had a Paragard IUD placed by Dr. Rip Harbour on 9/14. She had a recent SAB in August and negative GC/CT then and pap testing in 2016.  Interval History: She states her most recent period was heavier and more uncomfortable than what they usually were and she still has some spotting but no pain. No issues with sexual intercourse of her or her husband.   Review of Systems: her 12 point review of systems is negative or as noted in the History of Present Illness.  Medications: Paragard IUD Allergies: has No Known Allergies.  Physical Exam:  BP 99/66   Pulse 67   Ht 5' 6.53" (1.69 m)   Wt 116 lb (52.6 kg)   LMP 12/29/2015 (Exact Date)      BMI 18.42 kg/m   General appearance: Well nourished, well developed female in no acute distress.  Abdomen: diffusely non tender to palpation, non distended, and no masses, hernias Neuro/Psych:  Normal mood and affect.    Pelvic exam:  Two IUD strings present seen coming from the cervical os and approximately 3cm and tucked into the fornices. Cervix is very anterior, nttp, no active bleeding. Scant to minimal amount of old blood in the vault.  Uterus: small, nttp   Assessment: Pt stable with paragard in place  Plan: I told her that it's very normal to have a more heavy and painful period after a paragard and also menses can change after an SAB and increasing age but it's most likely due to the paragard. I also told her that AUB after placement of any IUD is common and to give it three months s/p insertion to see what her s/s are like. If still AUB, call us for evaluation or if AUB goes away but still having painful and heavy periods to the point she'd like intervention, she was told to let us know, too. Pt also told to repeat pap in two years.   Patient  amenable to plan  RTC PRN  Durene Romans MD Attending Center for [facility])

## 2016-06-20 ENCOUNTER — Ambulatory Visit (INDEPENDENT_AMBULATORY_CARE_PROVIDER_SITE_OTHER): Payer: Medicaid Other | Admitting: Obstetrics and Gynecology

## 2016-06-20 DIAGNOSIS — Z30432 Encounter for removal of intrauterine contraceptive device: Secondary | ICD-10-CM | POA: Diagnosis present

## 2016-06-20 NOTE — Progress Notes (Signed)
Patient ID: Ariana Baker, female   DOB: 1980/06/09, 35 y.o.   MRN: [id number]  Pt presents for removal of IUD d/t heavy painful cycles. Pt declines any medical treatment for problems. Uncertain as to choice of contraception after removal.  PE AF VSS  GYN: Speclum placed, IUD strings noted and grasped and IUD easily removed. IUD discarded. Pt tolerated well.   A/P IUD removal   Pt advised to use condoms for contraception. Information sheet on contraception choices provided to pt. F/U PRN

## 2016-06-20 NOTE — Patient Instructions (Signed)

## 2016-07-16 ENCOUNTER — Ambulatory Visit: Payer: Medicaid Other | Admitting: Obstetrics and Gynecology

## 2020-12-15 ENCOUNTER — Other Ambulatory Visit: Payer: Self-pay | Admitting: Nurse Practitioner

## 2020-12-15 ENCOUNTER — Other Ambulatory Visit: Payer: Self-pay | Admitting: Obstetrics & Gynecology

## 2020-12-15 DIAGNOSIS — Z1231 Encounter for screening mammogram for malignant neoplasm of breast: Secondary | ICD-10-CM

## 2021-01-24 ENCOUNTER — Ambulatory Visit
Admission: RE | Admit: 2021-01-24 | Discharge: 2021-01-24 | Disposition: A | Discharge status: Home / Self Care | Payer: Medicaid Other | Source: Ambulatory Visit | Attending: Nurse Practitioner | Admitting: Nurse Practitioner

## 2021-01-24 ENCOUNTER — Other Ambulatory Visit: Payer: Self-pay

## 2021-01-24 DIAGNOSIS — Z1231 Encounter for screening mammogram for malignant neoplasm of breast: Secondary | ICD-10-CM

## 2021-12-19 ENCOUNTER — Other Ambulatory Visit: Payer: Self-pay | Admitting: Nurse Practitioner

## 2021-12-19 ENCOUNTER — Other Ambulatory Visit (HOSPITAL_COMMUNITY)
Admission: RE | Admit: 2021-12-19 | Discharge: 2021-12-19 | Disposition: A | Discharge status: Home / Self Care | Payer: Medicaid Other | Source: Ambulatory Visit | Attending: Nurse Practitioner | Admitting: Nurse Practitioner

## 2021-12-19 DIAGNOSIS — Z124 Encounter for screening for malignant neoplasm of cervix: Secondary | ICD-10-CM | POA: Diagnosis present

## 2021-12-26 ENCOUNTER — Other Ambulatory Visit: Payer: Self-pay | Admitting: Nurse Practitioner

## 2021-12-26 DIAGNOSIS — Z1231 Encounter for screening mammogram for malignant neoplasm of breast: Secondary | ICD-10-CM

## 2021-12-29 LAB — CYTOLOGY - PAP: Adequacy: ABNORMAL

## 2022-01-08 ENCOUNTER — Other Ambulatory Visit (HOSPITAL_COMMUNITY)
Admission: RE | Admit: 2022-01-08 | Discharge: 2022-01-08 | Disposition: A | Discharge status: Home / Self Care | Payer: Medicaid Other | Source: Ambulatory Visit | Attending: Nurse Practitioner | Admitting: Nurse Practitioner

## 2022-01-08 DIAGNOSIS — Z124 Encounter for screening for malignant neoplasm of cervix: Secondary | ICD-10-CM | POA: Diagnosis not present

## 2022-01-11 LAB — CYTOLOGY - PAP
Comment: NEGATIVE
Diagnosis: NEGATIVE
High risk HPV: NEGATIVE

## 2022-01-29 ENCOUNTER — Ambulatory Visit: Payer: Medicaid Other

## 2022-02-06 ENCOUNTER — Ambulatory Visit
Admission: RE | Admit: 2022-02-06 | Discharge: 2022-02-06 | Disposition: A | Discharge status: Home / Self Care | Payer: Medicaid Other | Source: Ambulatory Visit | Attending: Nurse Practitioner | Admitting: Nurse Practitioner

## 2022-02-06 DIAGNOSIS — Z1231 Encounter for screening mammogram for malignant neoplasm of breast: Secondary | ICD-10-CM

## 2022-03-05 ENCOUNTER — Ambulatory Visit: Payer: Medicaid Other

## 2022-04-16 IMAGING — MG MM DIGITAL SCREENING BILAT W/ TOMO AND CAD
8 series · 9 of 24 positions shown · non-contrast
Comparison: None.

CLINICAL DATA: Screening.

EXAM:
DIGITAL SCREENING BILATERAL MAMMOGRAM WITH TOMOSYNTHESIS AND CAD
TECHNIQUE: Bilateral screening digital craniocaudal and mediolateral oblique
mammograms were obtained. Bilateral screening digital breast
tomosynthesis was performed. The images were evaluated with
computer-aided detection.

[R MLO synth-2D]
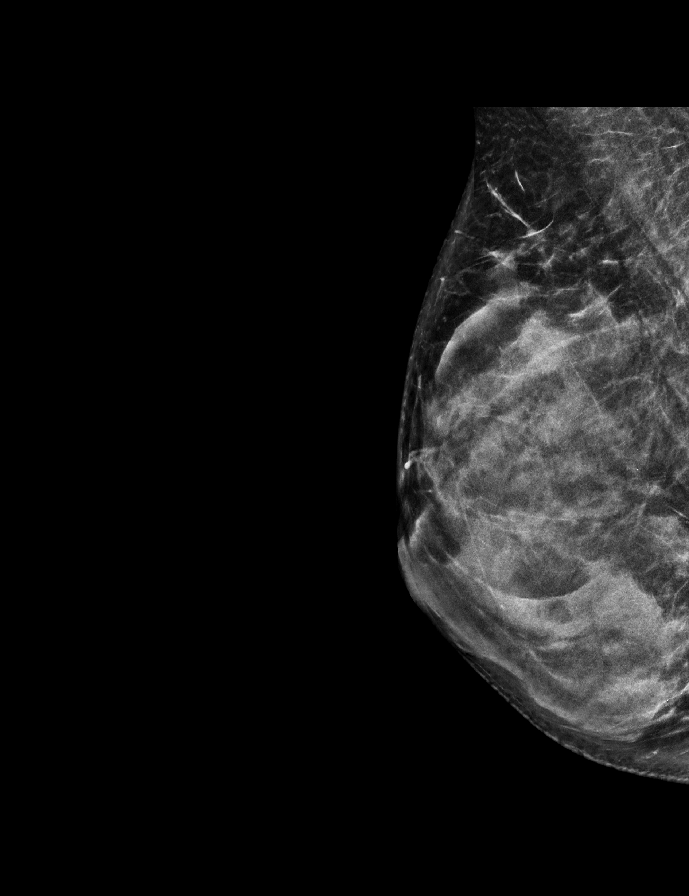

[L MLO synth-2D]
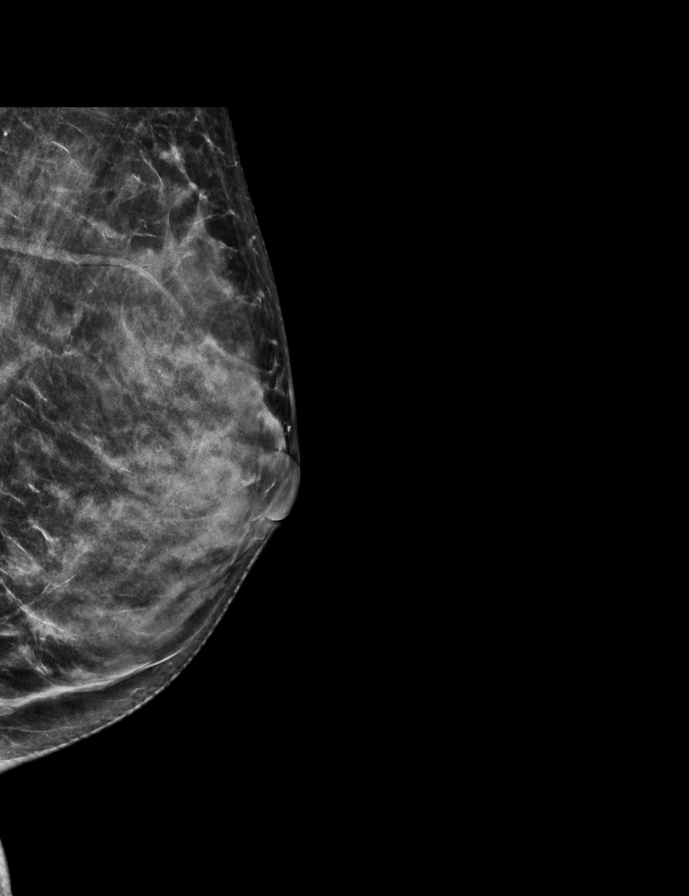

[L CC synth-2D]
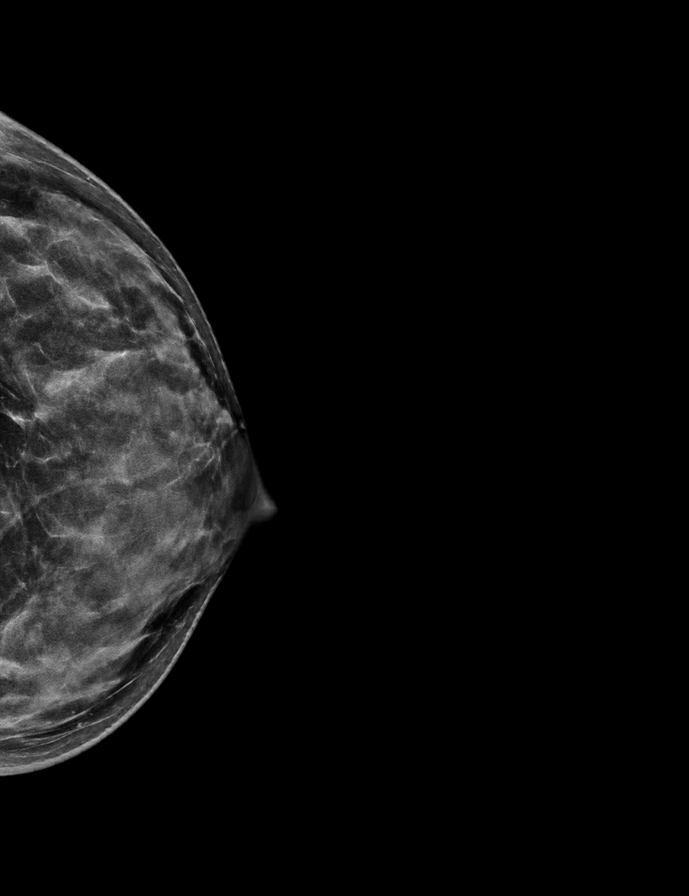

[R CC synth-2D]
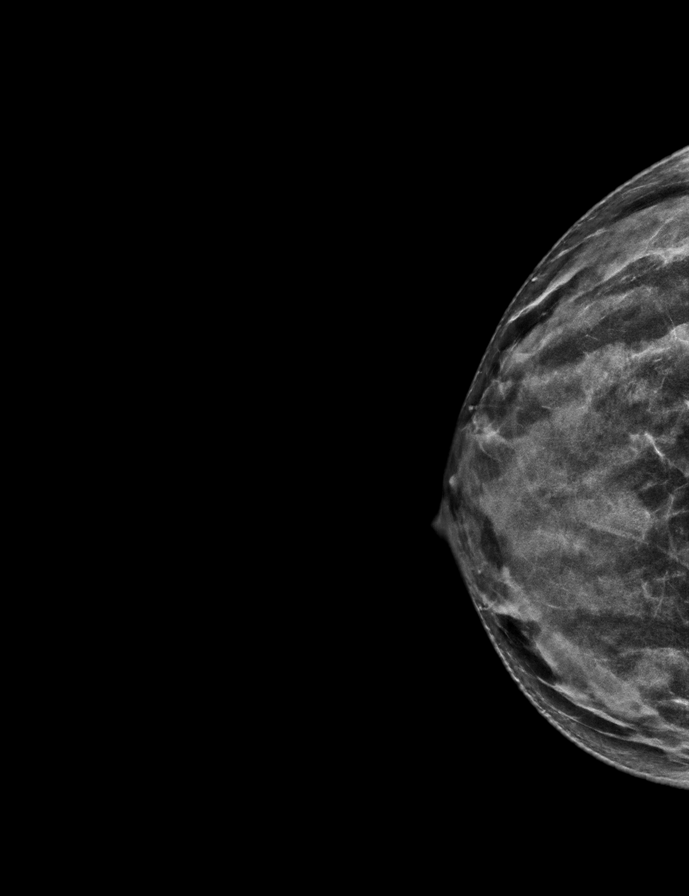

[R MLO tomo · 2 of 65 frames shown]
[frame 21/65]
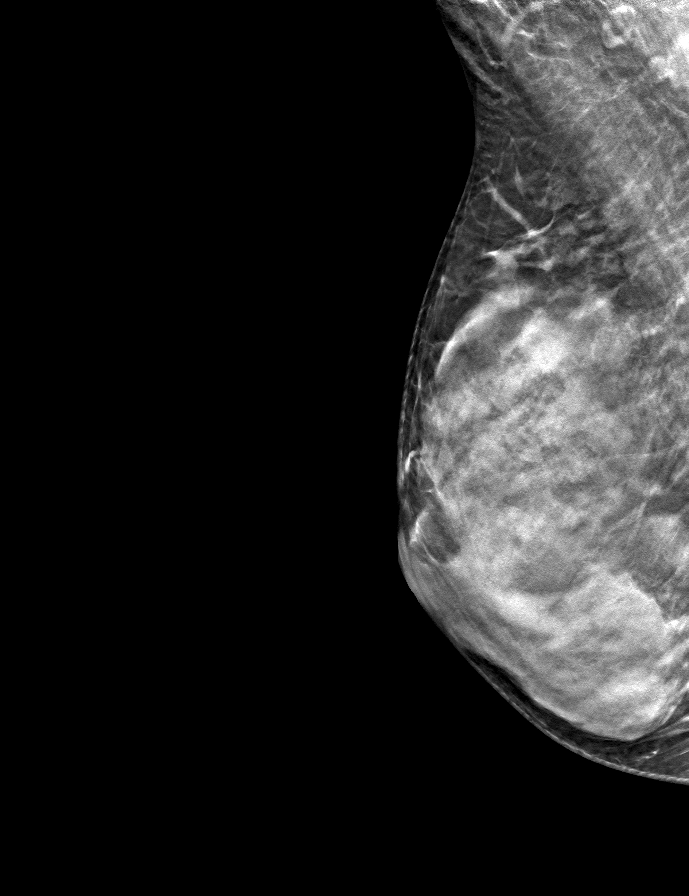
[frame 33/65]
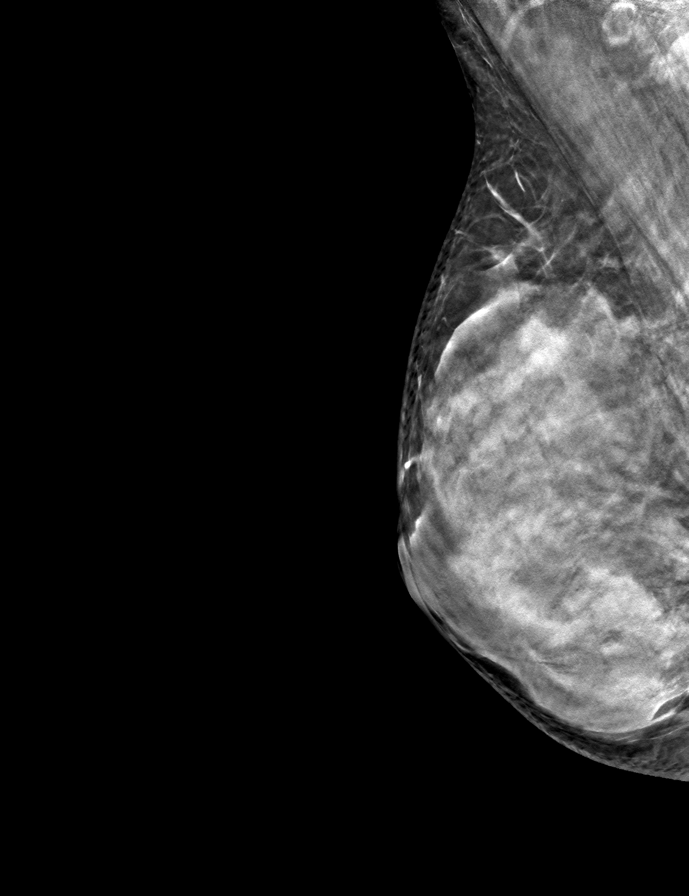

[R CC tomo · tomo slice 31/62.0]
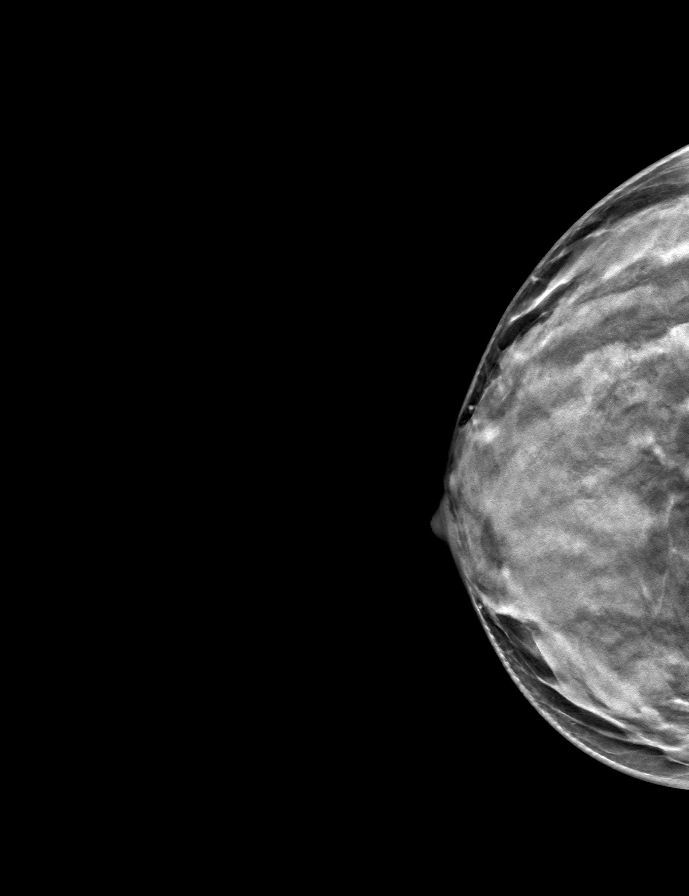

[L MLO tomo · tomo slice 31/61.0]
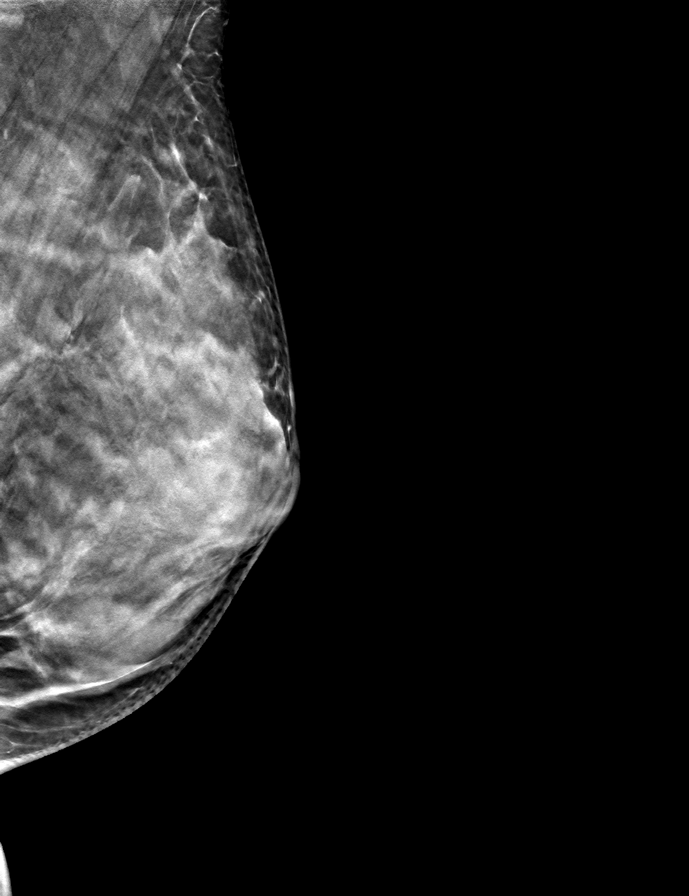

[L CC tomo · tomo slice 32/63.0]
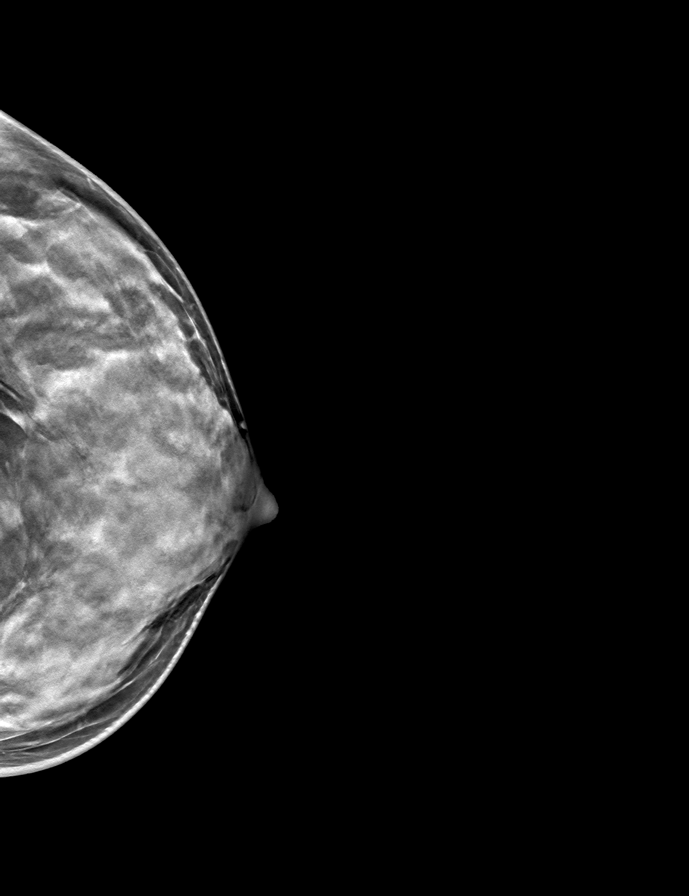

[9 of 24 positions shown; findings below may reference images not displayed]

ACR Breast Density Category d: The breast tissue is extremely dense,
which lowers the sensitivity of mammography.
FINDINGS: There are no findings suspicious for malignancy.
IMPRESSION: No mammographic evidence of malignancy. A result letter of this
screening mammogram will be mailed directly to the patient.

RECOMMENDATION:
Screening mammogram in one year. (Code:W3-Z-XIN)

BI-RADS CATEGORY  1: Negative.

## 2023-01-01 ENCOUNTER — Other Ambulatory Visit: Payer: Self-pay | Admitting: Nurse Practitioner

## 2023-01-01 DIAGNOSIS — Z1231 Encounter for screening mammogram for malignant neoplasm of breast: Secondary | ICD-10-CM

## 2023-02-11 ENCOUNTER — Ambulatory Visit: Payer: Medicaid Other

## 2023-02-17 NOTE — Progress Notes (Unsigned)
New Patient Note  RE: Ariana Baker MRN: [id number] DOB: 12-04-1980 Date of Office Visit: 02/18/2023  Consult requested by: Lorenda Ishihara,* Primary care provider: Lorenda Ishihara, MD  Chief Complaint: Urticaria, Rash (C/o outbreak x 1 month ago on neck and body), Pruritus, and Nasal Congestion (Post nasal drip)  History of Present Illness: I had the pleasure of seeing Ariana Baker for initial evaluation at the [facility] on 02/18/2023. She is a 42 y.o. female, who is referred here by Lorenda Ishihara, MD for the evaluation of allergic rhinitis and rash.  Discussed the use of AI scribe software for clinical note transcription with the patient, who gave verbal consent to proceed.  The patient, with no known history of skin conditions, presented with a month-long history of a pruritic, erythematous, raised rash that initially appeared on the neck and body. The rash was described as similar to multiple mosquito bites. Despite treatment with oral prednisone, the rash persisted. Subsequent treatment with triamcinolone/Clobetasol cream led to a slow improvement over a month, with new spots appearing as old ones resolved. The rash was particularly itchy after hot showers and at night when the body temperature increased.  In addition to the rash, the patient reported a month-long history of nasal congestion, particularly noticeable in the morning. There was no associated sneezing or significant nasal discharge. The patient had been using a prescribed Flonase nasal spray, with uncertain benefit.  The patient denied any other symptoms, changes in personal care products, or new medications. There was no history of similar rashes, allergies, or asthma. The patient works in [employer], with no recent changes in the work environment. The patient denied any fever, chills, changes in appetite, or bowel habits.  Patient dyed her hair 6 months ago.   Rash started about 1 month  ago. Mainly occurs on her neck and torso. Describes them as itchy, red, raised. No ecchymosis upon resolution. Associated symptoms include: none.  Frequency of episodes: daily x 4 weeks. Suspected triggers are unknown.   Denies any fevers, chills, changes in medications, foods, personal care products or recent infections. Systemic steroids: yes.  Previous work up includes: none. Previous history of rash/hives: no. Patient is up to date with the following cancer screening tests: physical exam, mammogram, pap smears.  Sinus:  She has used zyrtec, Flonase with minimal improvement in symptoms. Sinus infections: no. Previous work up includes: none. Previous ENT evaluation: no. History of reflux: denies.  Assessment and Plan: Meegan is a 42 y.o. female with: Rash and other nonspecific skin eruption Presented with a month-long history of an itchy, raised rash on the neck and body. The rash was unresponsive to prednisone and improved slowly with triamcinolone cream. Currently, the rash is almost completely resolved. No known triggers or associated symptoms. No history of similar rashes or eczema. Etiology unclear.  If you break out in rashes - take pictures. Write down what you had done/eaten that day.  Start allegra (fexofenadine) 180mg  twice a day - stop 3 days before skin testing visit.  Stop zyrtec (cetirizine).  If symptoms are not controlled or causes drowsiness let us know. Avoid the following potential triggers: alcohol, tight clothing, NSAIDs, hot showers and getting overheated. See below for proper skin care.  Get bloodwork. If bloodwork and allergy testing negative - recommend patch testing next.  Avoid clobetasol on the neck due to its strength.   Other allergic rhinitis Reports a month-long history of nasal congestion, particularly in the morning, and  a feeling of blockage in the right ear. No significant sneezing or runny nose. Currently using Flonase nasal spray with some  relief. Return for environmental allergy skin testing. Will make additional recommendations based on results. Use Flonase (fluticasone) nasal spray 1-2 sprays per nostril once a day as needed for nasal congestion.  Use azelastine nasal spray 1-2 sprays per nostril twice a day as needed for runny nose/drainage - stop 3 days before skin testing visit. Stop all other nasal sprays.   Nasal saline spray (i.e., Simply Saline) or nasal saline lavage (i.e., NeilMed) is recommended as needed and prior to medicated nasal sprays.  Return in about 9 days (around 02/27/2023) for Skin testing.  Meds ordered this encounter  Medications   fluticasone (FLONASE) 50 MCG/ACT nasal spray    Sig: Place 1-2 sprays into both nostrils daily as needed (nasal congestion).    Dispense:  16 g    Refill:  3   azelastine (ASTELIN) 0.1 % nasal spray    Sig: Place 1-2 sprays into both nostrils 2 (two) times daily as needed (nasal drainage). Use in each nostril as directed    Dispense:  30 mL    Refill:  3   Lab Orders         Alpha-Gal Panel         ANA w/Reflex         C3 and C4         CBC with Differential/Platelet         Chronic Urticaria         Comprehensive metabolic panel         C-reactive protein         Sedimentation rate         Thyroid Cascade Profile         Tryptase      Other allergy screening: Asthma: no Food allergy: no Medication allergy: no Hymenoptera allergy: no Eczema:no History of recurrent infections suggestive of immunodeficency: no  Diagnostics: None.   Past Medical History: Patient Active Problem List   Diagnosis Date Noted   Encounter for removal of intrauterine contraceptive device (IUD) 06/20/2016   Abnormal uterine bleeding (AUB) 02/09/2016   Past Medical History:  Diagnosis Date   Medical history non-contributory    Past Surgical History: Past Surgical History:  Procedure Laterality Date   APPENDECTOMY     WISDOM TOOTH EXTRACTION     Medication List:   Current Outpatient Medications  Medication Sig Dispense Refill   azelastine (ASTELIN) 0.1 % nasal spray Place 1-2 sprays into both nostrils 2 (two) times daily as needed (nasal drainage). Use in each nostril as directed 30 mL 3   cetirizine (ZYRTEC) 10 MG chewable tablet Chew 10 mg by mouth daily.     cholecalciferol (VITAMIN D) 1000 units tablet Take 1,000 Units by mouth daily.     cyclobenzaprine (FLEXERIL) 5 MG tablet Take 5 mg by mouth 2 (two) times daily as needed for muscle spasms.     fluticasone (FLONASE) 50 MCG/ACT nasal spray Place 1-2 sprays into both nostrils daily as needed (nasal congestion). 16 g 3   triamcinolone cream (KENALOG) 0.1 % SMARTSIG:1 Application Topical 2-3 Times Daily     No current facility-administered medications for this visit.   Allergies: No Known Allergies Social History: Social History   Socioeconomic History   Marital status: Married    Spouse name: Not on file   Number of children: Not on file   Years of education:  Not on file   Highest education level: Not on file  Occupational History   Not on file  Tobacco Use   Smoking status: Never    Passive exposure: Never   Smokeless tobacco: Never  Vaping Use   Vaping status: Never Used  Substance and Sexual Activity   Alcohol use: No   Drug use: No   Sexual activity: Yes    Birth control/protection: Condom  Other Topics Concern   Not on file  Social History Narrative   Not on file   Social Determinants of Health   Financial Resource Strain: Not on file  Food Insecurity: Not on file  Transportation Needs: Not on file  Physical Activity: Not on file  Stress: Not on file  Social Connections: Not on file   Lives in a townhome. Smoking: denies Occupation: Theatre manager.   Environmental History: Water Damage/mildew in the house: no Carpet in the family room: no Carpet in the bedroom: no Heating: gas Cooling: central Pet: no  Family History: Family History  Problem Relation  Age of Onset   Breast cancer Neg Hx    Problem                               Relation Asthma                                   no Eczema                                no Food allergy                          no Allergic rhino conjunctivitis     no  Review of Systems  Constitutional:  Negative for appetite change, chills, fever and unexpected weight change.  HENT:  Positive for congestion. Negative for rhinorrhea.   Eyes:  Negative for itching.  Respiratory:  Negative for cough, chest tightness, shortness of breath and wheezing.   Cardiovascular:  Negative for chest pain.  Gastrointestinal:  Negative for abdominal pain.  Genitourinary:  Negative for difficulty urinating.  Skin:  Positive for rash.  Neurological:  Negative for headaches.    Objective: BP 100/80   Pulse 75   Temp 98.2 F (36.8 C)   Resp 16   Ht 5' 7.32" (1.71 m)   Wt 123 lb 14.4 oz (56.2 kg)   SpO2 100%   BMI 19.22 kg/m  Body mass index is 19.22 kg/m. Physical Exam Vitals and nursing note reviewed.  Constitutional:      Appearance: Normal appearance. She is well-developed.  HENT:     Head: Normocephalic and atraumatic.     Right Ear: Tympanic membrane and external ear normal.     Left Ear: Tympanic membrane and external ear normal.     Nose: Nose normal.     Mouth/Throat:     Mouth: Mucous membranes are moist.     Pharynx: Oropharynx is clear.  Eyes:     Conjunctiva/sclera: Conjunctivae normal.  Cardiovascular:     Rate and Rhythm: Normal rate and regular rhythm.     Heart sounds: Normal heart sounds. No murmur heard.    No friction rub. No gallop.  Pulmonary:     Effort: Pulmonary effort is normal.  Breath sounds: Normal breath sounds. No wheezing, rhonchi or rales.  Musculoskeletal:     Cervical back: Neck supple.  Skin:    General: Skin is warm.     Findings: No rash.     Comments: Mild skin discolorations on the abdominal area and 2 spots on the anterior neck area.  Neurological:      Mental Status: She is alert and oriented to person, place, and time.  Psychiatric:        Behavior: Behavior normal.   The plan was reviewed with the patient/family, and all questions/concerned were addressed.  It was my pleasure to see Ariana Baker today and participate in her care. Please feel free to contact me with any questions or concerns.  Sincerely,  Wyline Mood, DO Allergy & Immunology  [facility] office: [phone] [facility] office: [phone]

## 2023-02-18 ENCOUNTER — Ambulatory Visit (INDEPENDENT_AMBULATORY_CARE_PROVIDER_SITE_OTHER): Payer: Medicaid Other | Admitting: Allergy

## 2023-02-18 ENCOUNTER — Other Ambulatory Visit: Payer: Self-pay

## 2023-02-18 ENCOUNTER — Encounter: Payer: Self-pay | Admitting: Allergy

## 2023-02-18 VITALS — BP 100/80 | HR 75 | Temp 98.2°F | Resp 16 | Ht 67.32 in | Wt 123.9 lb

## 2023-02-18 DIAGNOSIS — J3089 Other allergic rhinitis: Secondary | ICD-10-CM

## 2023-02-18 DIAGNOSIS — R21 Rash and other nonspecific skin eruption: Secondary | ICD-10-CM

## 2023-02-18 MED ORDER — AZELASTINE HCL 0.1 % NA SOLN: 1.0000 | Freq: Two times a day (BID) | NASAL | 3 refills | Status: AC | PRN

## 2023-02-18 MED ORDER — FLUTICASONE PROPIONATE 50 MCG/ACT NA SUSP: 1.0000 | Freq: Every day | NASAL | 3 refills | Status: AC | PRN

## 2023-02-18 NOTE — Patient Instructions (Addendum)
Rash I'm not sure what's causing your rashes.  If you break out in rashes - take pictures. Write down what you had done/eaten that day.  Start allegra (fexofenadine) 180mg  twice a day - stop 3 days before skin testing visit.  Stop zyrtec (cetirizine).  If symptoms are not controlled or causes drowsiness let us know. Avoid the following potential triggers: alcohol, tight clothing, NSAIDs, hot showers and getting overheated. See below for proper skin care.  Get bloodwork. We are ordering labs, so please allow 1-2 weeks for the results to come back. With the newly implemented Cures Act, the labs might be visible to you at the same time that they become visible to me. However, I will not address the results until all of the results are back, so please be patient.   Rhinitis  Return for environmental allergy skin testing. Will make additional recommendations based on results. Make sure you don't take any antihistamines for 3 days before the skin testing appointment. Don't put any lotion on the back and arms on the day of testing.  Plan on being here for 30-60 minutes.  Use Flonase (fluticasone) nasal spray 1-2 sprays per nostril once a day as needed for nasal congestion.  Use azelastine nasal spray 1-2 sprays per nostril twice a day as needed for runny nose/drainage - stop 3 days before skin testing visit. Stop all other nasal sprays.   Nasal saline spray (i.e., Simply Saline) or nasal saline lavage (i.e., NeilMed) is recommended as needed and prior to medicated nasal sprays.  Return for Skin testing.  Skin care recommendations  Bath time: Always use lukewarm water. AVOID very hot or cold water. Keep bathing time to 5-10 minutes. Do NOT use bubble bath. Use a mild soap and use just enough to wash the dirty areas. Do NOT scrub skin vigorously.  After bathing, pat dry your skin with a towel. Do NOT rub or scrub the skin.  Moisturizers and prescriptions:  ALWAYS apply moisturizers  immediately after bathing (within 3 minutes). This helps to lock-in moisture. Use the moisturizer several times a day over the whole body. Good summer moisturizers include: Aveeno, CeraVe, Cetaphil. Good winter moisturizers include: Aquaphor, Vaseline, Cerave, Cetaphil, Eucerin, Vanicream. When using moisturizers along with medications, the moisturizer should be applied about one hour after applying the medication to prevent diluting effect of the medication or moisturize around where you applied the medications. When not using medications, the moisturizer can be continued twice daily as maintenance.  Laundry and clothing: Avoid laundry products with added color or perfumes. Use unscented hypo-allergenic laundry products such as Tide free, Cheer free & gentle, and All free and clear.  If the skin still seems dry or sensitive, you can try double-rinsing the clothes. Avoid tight or scratchy clothing such as wool. Do not use fabric softeners or dyer sheets.  See below for proper skin care. Use fragrance free and dye free products. No dryer sheets or fabric softener.

## 2023-02-20 ENCOUNTER — Ambulatory Visit: Payer: Medicaid Other

## 2023-02-26 LAB — ALPHA-GAL PANEL
Allergen Lamb IgE: <0.1 kU/L
Beef IgE: <0.1 kU/L
IgE (Immunoglobulin E), Serum: 19 [IU]/mL (ref 6–495)
O215-IgE Alpha-Gal: <0.1 kU/L
Pork IgE: <0.1 kU/L

## 2023-02-26 LAB — CBC WITH DIFFERENTIAL/PLATELET
Basophils Absolute: 0 10*3/uL (ref 0.0–0.2)
Basos: 1 %
EOS (ABSOLUTE): 0.1 10*3/uL (ref 0.0–0.4)
Eos: 1 %
Hematocrit: 40.8 % (ref 34.0–46.6)
Hemoglobin: 12.9 g/dL (ref 11.1–15.9)
Immature Grans (Abs): 0 10*3/uL (ref 0.0–0.1)
Immature Granulocytes: 0 %
Lymphocytes Absolute: 1.6 10*3/uL (ref 0.7–3.1)
Lymphs: 23 %
MCH: 29.1 pg (ref 26.6–33.0)
MCHC: 31.6 g/dL (ref 31.5–35.7)
MCV: 92 fL (ref 79–97)
Monocytes Absolute: 0.5 10*3/uL (ref 0.1–0.9)
Monocytes: 7 %
Neutrophils Absolute: 4.6 10*3/uL (ref 1.4–7.0)
Neutrophils: 68 %
Platelets: 336 10*3/uL (ref 150–450)
RBC: 4.44 x10E6/uL (ref 3.77–5.28)
RDW: 12.1 % (ref 11.7–15.4)
WBC: 6.8 10*3/uL (ref 3.4–10.8)

## 2023-02-26 LAB — TRYPTASE: Tryptase: 2.7 ug/L (ref 2.2–13.2)

## 2023-02-26 LAB — ANA W/REFLEX: Anti Nuclear Antibody (ANA): NEGATIVE

## 2023-02-26 LAB — C-REACTIVE PROTEIN: CRP: 3 mg/L (ref 0–10)

## 2023-02-26 LAB — COMPREHENSIVE METABOLIC PANEL
ALT: 8 IU/L (ref 0–32)
AST: 15 IU/L (ref 0–40)
Albumin: 3.9 g/dL (ref 3.9–4.9)
Alkaline Phosphatase: 37 IU/L — ABNORMAL LOW (ref 44–121)
BUN/Creatinine Ratio: 16 (ref 9–23)
BUN: 8 mg/dL (ref 6–24)
Bilirubin Total: 0.4 mg/dL (ref 0.0–1.2)
CO2: 21 mmol/L (ref 20–29)
Calcium: 9.3 mg/dL (ref 8.7–10.2)
Chloride: 102 mmol/L (ref 96–106)
Creatinine, Ser: 0.51 mg/dL — ABNORMAL LOW (ref 0.57–1.00)
Globulin, Total: 2.8 g/dL (ref 1.5–4.5)
Glucose: 83 mg/dL (ref 70–99)
Potassium: 4.1 mmol/L (ref 3.5–5.2)
Sodium: 139 mmol/L (ref 134–144)
Total Protein: 6.7 g/dL (ref 6.0–8.5)
eGFR: 119 mL/min/{1.73_m2} (ref >59)

## 2023-02-26 LAB — THYROID CASCADE PROFILE: TSH: 1.06 u[IU]/mL (ref 0.450–4.500)

## 2023-02-26 LAB — CHRONIC URTICARIA: cu index: 7.4 (ref <10)

## 2023-02-26 LAB — C3 AND C4
Complement C3, Serum: 151 mg/dL (ref 82–167)
Complement C4, Serum: 26 mg/dL (ref 12–38)

## 2023-02-26 LAB — SEDIMENTATION RATE: Sed Rate: 17 mm/h (ref 0–32)

## 2023-02-26 NOTE — Progress Notes (Unsigned)
   Skin testing note  RE: Ariana Baker MRN: [id number] DOB: 12-13-1980 Date of Office Visit: 02/27/2023  Referring provider: Lorenda Ishihara,* Primary care provider: Lorenda Ishihara, MD  Chief Complaint: No chief complaint on file.  History of Present Illness: I had the pleasure of seeing Ariana Baker for a skin testing [facility]  on 02/26/2023. She is a 42 y.o. female, who is being followed for rash and allergic rhinitis. Her previous allergy office visit was on 02/18/2023 with Dr. Selena Batten. Today is a skin testing visit.   Discussed the use of AI scribe software for clinical note transcription with the patient, who gave verbal consent to proceed.  History of Present Illness             Rash and other nonspecific skin eruption Presented with a month-long history of an itchy, raised rash on the neck and body. The rash was unresponsive to prednisone and improved slowly with triamcinolone cream. Currently, the rash is almost completely resolved. No known triggers or associated symptoms. No history of similar rashes or eczema. Etiology unclear.  If you break out in rashes - take pictures. Write down what you had done/eaten that day.  Start allegra (fexofenadine) 180mg  twice a day - stop 3 days before skin testing visit.  Stop zyrtec (cetirizine).  If symptoms are not controlled or causes drowsiness let us know. Avoid the following potential triggers: alcohol, tight clothing, NSAIDs, hot showers and getting overheated. See below for proper skin care.  Get bloodwork. If bloodwork and allergy testing negative - recommend patch testing next.  Avoid clobetasol on the neck due to its strength.    Other allergic rhinitis Reports a month-long history of nasal congestion, particularly in the morning, and a feeling of blockage in the right ear. No significant sneezing or runny nose. Currently using Flonase nasal spray with some relief. Return for environmental  allergy skin testing. Will make additional recommendations based on results. Use Flonase (fluticasone) nasal spray 1-2 sprays per nostril once a day as needed for nasal congestion.  Use azelastine nasal spray 1-2 sprays per nostril twice a day as needed for runny nose/drainage - stop 3 days before skin testing visit. Stop all other nasal sprays.   Nasal saline spray (i.e., Simply Saline) or nasal saline lavage (i.e., NeilMed) is recommended as needed and prior to medicated nasal sprays. Assessment and Plan: Ariana Baker is a 42 y.o. female with: ***  Assessment and Plan              No follow-ups on file.  No orders of the defined types were placed in this encounter.  Lab Orders  No laboratory test(s) ordered today    Diagnostics: Skin Testing: Environmental allergy panel and select foods. *** Results discussed with patient/family.   Previous notes and tests were reviewed. The plan was reviewed with the patient/family, and all questions/concerned were addressed.  It was my pleasure to see Ariana Baker today and participate in her care. Please feel free to contact me with any questions or concerns.  Sincerely,  Wyline Mood, DO Allergy & Immunology  [facility] [place] office: [phone] [facility] office: [phone]

## 2023-02-27 ENCOUNTER — Ambulatory Visit (INDEPENDENT_AMBULATORY_CARE_PROVIDER_SITE_OTHER): Payer: Medicaid Other | Admitting: Allergy

## 2023-02-27 ENCOUNTER — Encounter: Payer: Self-pay | Admitting: Allergy

## 2023-02-27 DIAGNOSIS — J3089 Other allergic rhinitis: Secondary | ICD-10-CM

## 2023-02-27 DIAGNOSIS — R21 Rash and other nonspecific skin eruption: Secondary | ICD-10-CM | POA: Diagnosis not present

## 2023-02-27 NOTE — Progress Notes (Signed)
Reviewed labs during OV.  I reviewed the bloodwork. Blood count, kidney function, liver function, electrolytes, thyroid, autoimmune screener, inflammation markers, chronic urticaria index (checks for autoantibodies that trigger mast cells), tryptase (checks for mast cell issues) and alpha gal (checks for red meat allergy) were all normal which is great.

## 2023-02-27 NOTE — Patient Instructions (Addendum)
Today's skin testing positive to grass, trees, mold, tobacco. Negative to common foods.   Avoid tobacco exposure.  Results given.  Environmental allergies Start environmental control measures as below. Consider allergy injections for long term control if above medications do not help the symptoms - handout given.  Use Flonase (fluticasone) nasal spray 1-2 sprays per nostril once a day as needed for nasal congestion.  Use azelastine nasal spray 1-2 sprays per nostril twice a day as needed for runny nose/drainage. Nasal saline spray (i.e., Simply Saline) or nasal saline lavage (i.e., NeilMed) is recommended as needed and prior to medicated nasal sprays.  Rash I'm not sure what's causing your rashes.  If you break out in rashes - take pictures. Write down what you had done/eaten that day.  Start allegra (fexofenadine) 180mg  twice a day. If symptoms are not controlled or causes drowsiness let us know. Avoid the following potential triggers: alcohol, tight clothing, NSAIDs, hot showers and getting overheated. Continue proper skin care.   Return in about 2 months (around 04/29/2023).  Reducing Pollen Exposure Pollen seasons: trees (spring), grass (summer) and ragweed/weeds (fall). Keep windows closed in your home and car to lower pollen exposure.  Install air conditioning in the bedroom and throughout the house if possible.  Avoid going out in dry windy days - especially early morning. Pollen counts are highest between 5 - 10 AM and on dry, hot and windy days.  Save outside activities for late afternoon or after a heavy rain, when pollen levels are lower.  Avoid mowing of grass if you have grass pollen allergy. Be aware that pollen can also be transported indoors on people and pets.  Dry your clothes in an automatic dryer rather than hanging them outside where they might collect pollen.  Rinse hair and eyes before bedtime.  Mold Control Mold and fungi can grow on a variety of surfaces  provided certain temperature and moisture conditions exist.  Outdoor molds grow on plants, decaying vegetation and soil. The major outdoor mold, Alternaria and Cladosporium, are found in very high numbers during hot and dry conditions. Generally, a late summer - fall peak is seen for common outdoor fungal spores. Rain will temporarily lower outdoor mold spore count, but counts rise rapidly when the rainy period ends. The most important indoor molds are Aspergillus and Penicillium. Dark, humid and poorly ventilated basements are ideal sites for mold growth. The next most common sites of mold growth are the bathroom and the kitchen. Outdoor (Seasonal) Mold Control Use air conditioning and keep windows closed. Avoid exposure to decaying vegetation. Avoid leaf raking. Avoid grain handling. Consider wearing a face mask if working in moldy areas.  Indoor (Perennial) Mold Control  Maintain humidity below 50%. Get rid of mold growth on hard surfaces with water, detergent and, if necessary, 5% bleach (do not mix with other cleaners). Then dry the area completely. If mold covers an area more than 10 square feet, consider hiring an indoor environmental professional. For clothing, washing with soap and water is best. If moldy items cannot be cleaned and dried, throw them away. Remove sources e.g. contaminated carpets. Repair and seal leaking roofs or pipes. Using dehumidifiers in damp basements may be helpful, but empty the water and clean units regularly to prevent mildew from forming. All rooms, especially basements, bathrooms and kitchens, require ventilation and cleaning to deter mold and mildew growth. Avoid carpeting on concrete or damp floors, and storing items in damp areas.   Skin care recommendations  [facility]  time: Always use lukewarm water. AVOID very hot or cold water. Keep bathing time to 5-10 minutes. Do NOT use bubble bath. Use a mild soap and use just enough to wash the dirty areas. Do NOT  scrub skin vigorously.  After bathing, pat dry your skin with a towel. Do NOT rub or scrub the skin.  Moisturizers and prescriptions:  ALWAYS apply moisturizers immediately after bathing (within 3 minutes). This helps to lock-in moisture. Use the moisturizer several times a day over the whole body. Good summer moisturizers include: Aveeno, CeraVe, Cetaphil. Good winter moisturizers include: Aquaphor, Vaseline, Cerave, Cetaphil, Eucerin, Vanicream. When using moisturizers along with medications, the moisturizer should be applied about one hour after applying the medication to prevent diluting effect of the medication or moisturize around where you applied the medications. When not using medications, the moisturizer can be continued twice daily as maintenance.  Laundry and clothing: Avoid laundry products with added color or perfumes. Use unscented hypo-allergenic laundry products such as Tide free, Cheer free & gentle, and All free and clear.  If the skin still seems dry or sensitive, you can try double-rinsing the clothes. Avoid tight or scratchy clothing such as wool. Do not use fabric softeners or dyer sheets.  See below for proper skin care. Use fragrance free and dye free products. No dryer sheets or fabric softener.

## 2023-02-28 ENCOUNTER — Ambulatory Visit
Admission: RE | Admit: 2023-02-28 | Discharge: 2023-02-28 | Disposition: A | Discharge status: Home / Self Care | Payer: Medicaid Other | Source: Ambulatory Visit | Attending: Nurse Practitioner | Admitting: Nurse Practitioner

## 2023-02-28 DIAGNOSIS — Z1231 Encounter for screening mammogram for malignant neoplasm of breast: Secondary | ICD-10-CM

## 2023-04-29 ENCOUNTER — Ambulatory Visit: Payer: Medicaid Other | Admitting: Allergy

## 2024-01-21 ENCOUNTER — Other Ambulatory Visit: Payer: Self-pay | Admitting: Internal Medicine

## 2024-01-21 DIAGNOSIS — Z1231 Encounter for screening mammogram for malignant neoplasm of breast: Secondary | ICD-10-CM

## 2024-02-04 ENCOUNTER — Other Ambulatory Visit (HOSPITAL_COMMUNITY): Payer: Self-pay | Admitting: Sports Medicine

## 2024-02-04 DIAGNOSIS — M542 Cervicalgia: Secondary | ICD-10-CM

## 2024-02-10 ENCOUNTER — Ambulatory Visit (HOSPITAL_COMMUNITY)
Admission: RE | Admit: 2024-02-10 | Discharge: 2024-02-10 | Disposition: A | Discharge status: Home / Self Care | Source: Ambulatory Visit | Attending: Sports Medicine | Admitting: Sports Medicine

## 2024-02-10 DIAGNOSIS — M542 Cervicalgia: Secondary | ICD-10-CM | POA: Insufficient documentation

## 2024-03-02 ENCOUNTER — Ambulatory Visit
Admission: RE | Admit: 2024-03-02 | Discharge: 2024-03-02 | Disposition: A | Discharge status: Home / Self Care | Source: Ambulatory Visit | Attending: Internal Medicine | Admitting: Internal Medicine

## 2024-03-02 DIAGNOSIS — Z1231 Encounter for screening mammogram for malignant neoplasm of breast: Secondary | ICD-10-CM
